# Patient Record
Sex: Male | Born: 1965 | Race: Black or African American | Hispanic: No | Marital: Single | Smoking: Never smoker
Health system: Southern US, Community
[De-identification: ages and names within clinical notes are randomized; demographics above are authoritative.]

## PROBLEM LIST (undated history)

## (undated) DIAGNOSIS — I10 Essential (primary) hypertension: Secondary | ICD-10-CM

## (undated) HISTORY — PX: KNEE SURGERY: SHX244

---

## 2016-02-12 ENCOUNTER — Emergency Department: Payer: Non-veteran care

## 2016-02-12 ENCOUNTER — Encounter: Payer: Self-pay | Admitting: Urgent Care

## 2016-02-12 ENCOUNTER — Emergency Department
Admission: EM | Admit: 2016-02-12 | Discharge: 2016-02-12 | Disposition: A | Discharge status: Home / Self Care | Payer: Non-veteran care | Attending: Emergency Medicine | Admitting: Emergency Medicine

## 2016-02-12 DIAGNOSIS — R0989 Other specified symptoms and signs involving the circulatory and respiratory systems: Secondary | ICD-10-CM | POA: Diagnosis not present

## 2016-02-12 DIAGNOSIS — E876 Hypokalemia: Secondary | ICD-10-CM | POA: Diagnosis not present

## 2016-02-12 DIAGNOSIS — R002 Palpitations: Secondary | ICD-10-CM

## 2016-02-12 DIAGNOSIS — I10 Essential (primary) hypertension: Secondary | ICD-10-CM | POA: Insufficient documentation

## 2016-02-12 HISTORY — DX: Essential (primary) hypertension: I10

## 2016-02-12 LAB — CBC
HCT: 41.1 % (ref 40.0–52.0)
HEMOGLOBIN: 13.7 g/dL (ref 13.0–18.0)
MCH: 26.1 pg (ref 26.0–34.0)
MCHC: 33.3 g/dL (ref 32.0–36.0)
MCV: 78.6 fL — ABNORMAL LOW (ref 80.0–100.0)
Platelets: 230 10*3/uL (ref 150–440)
RBC: 5.23 MIL/uL (ref 4.40–5.90)
RDW: 16.6 % — AB (ref 11.5–14.5)
WBC: 7.8 10*3/uL (ref 3.8–10.6)

## 2016-02-12 LAB — BASIC METABOLIC PANEL
ANION GAP: 8 (ref 5–15)
BUN: 17 mg/dL (ref 6–20)
CALCIUM: 9.3 mg/dL (ref 8.9–10.3)
CO2: 29 mmol/L (ref 22–32)
Chloride: 101 mmol/L (ref 101–111)
Creatinine, Ser: 1.08 mg/dL (ref 0.61–1.24)
GFR calc Af Amer: >60 mL/min (ref >60)
GLUCOSE: 95 mg/dL (ref 65–99)
Potassium: 3.2 mmol/L — ABNORMAL LOW (ref 3.5–5.1)
Sodium: 138 mmol/L (ref 135–145)

## 2016-02-12 LAB — TROPONIN I: TROPONIN I: 0.03 ng/mL (ref <0.031)

## 2016-02-12 MED ORDER — POTASSIUM CHLORIDE CRYS ER 20 MEQ PO TBCR
40.0000 meq | EXTENDED_RELEASE_TABLET | Freq: Once | ORAL | Status: AC
  Administered 2016-02-12: 40 meq via ORAL

## 2016-02-12 MED ORDER — POTASSIUM CHLORIDE CRYS ER 20 MEQ PO TBCR
EXTENDED_RELEASE_TABLET | ORAL | Status: AC
  Administered 2016-02-12: 40 meq via ORAL
  Filled 2016-02-12: qty 2

## 2016-02-12 NOTE — Discharge Instructions (Signed)
Please go to your appointment with Dr. Milta DeitersKhan's office tomorrow morning at 11 AM to be fitted for a Holter monitor.  Please return to the emergency department if you develop shortness of breath, chest pain or pressure, lightheadedness or fainting, or any other symptoms concerning to you.

## 2016-02-12 NOTE — ED Provider Notes (Signed)
[facility] Emergency Department Provider Note  ____________________________________________  Time seen: Approximately 8:44 PM  I have reviewed the triage vital signs and the nursing notes.   HISTORY  Chief Complaint Palpitations    HPI Paul Munoz is a 50 y.o. male with a history of hypertension on Lasix presenting with palpitations. The patient reports that for the past 3 days he has had approximate 5 daily episodes of a brief episode of palpitations that makes him feel like he has to sit down and catch his breath. These episodes self resolve and he does not know any inciting factors. No associated lightheadedness, syncope, chest pain, tightness, or pressure. No lower extremity swelling or calf pain.   Past Medical History  Diagnosis Date  . Hypertension     There are no active problems to display for this patient.   Past Surgical History  Procedure Laterality Date  . Knee surgery Left     No current outpatient prescriptions on file.  Allergies Review of patient's allergies indicates no known allergies.  No family history on file.  Social History Social History  Substance Use Topics  . Smoking status: Never Smoker   . Smokeless tobacco: None  . Alcohol Use: No    Review of Systems Constitutional: No fever/chills. Eyes: No visual changes. ENT: No sore throat. No congestion or rhinorrhea. Cardiovascular: Denies chest pain. Positive palpitations. Respiratory: Denies shortness of breath.  No cough. Gastrointestinal: No abdominal pain.  No nausea, no vomiting.  No diarrhea.  No constipation. Genitourinary: Negative for dysuria. Musculoskeletal: Negative for back pain. Skin: Negative for rash. Neurological: Negative for headaches. No focal numbness, tingling or weakness.   10-point ROS otherwise negative.  ____________________________________________   PHYSICAL EXAM:  VITAL SIGNS: ED Triage Vitals  Enc Vitals Group     BP  02/12/16 2002 153/88 mmHg     Pulse Rate 02/12/16 2002 91     Resp 02/12/16 2002 18     Temp 02/12/16 2002 98.6 F (37 C)     Temp Source 02/12/16 2002 Oral     SpO2 02/12/16 2002 97 %     Weight 02/12/16 2002 340 lb (154.223 kg)     Height 02/12/16 2002  (1.956 m)     Head Cir --      Peak Flow --      Pain Score 02/12/16 2009 0     Pain Loc --      Pain Edu? --      Excl. in GC? --     Constitutional: Alert and oriented. Well appearing and in no acute distress. Answers questions appropriately. Eyes: Conjunctivae are normal.  EOMI. No scleral icterus. Head: Atraumatic. Nose: No congestion/rhinnorhea. Mouth/Throat: Mucous membranes are moist.  Neck: No stridor.  Supple.   Cardiovascular: Normal rate, regular rhythm. No murmurs, rubs or gallops.  Respiratory: Normal respiratory effort.  No accessory muscle use or retractions. Lungs CTAB.  No wheezes, rales or ronchi. Gastrointestinal: Obese. Soft, nontender and nondistended.  No guarding or rebound.  No peritoneal signs. Musculoskeletal: Trace symmetric nonpitting lower extremity edema around the ankles only. No ttp in the calves or palpable cords.  Negative Homan's sign. Neurologic:  A&Ox3.  Speech is clear.  Face and smile are symmetric.  EOMI.  Moves all extremities well. Skin:  Skin is warm, dry and intact. No rash noted. Psychiatric: Mood and affect are normal. Speech and behavior are normal.  Normal judgement.  ____________________________________________   LABS (all labs ordered  are listed, but only abnormal results are displayed)  Labs Reviewed  BASIC METABOLIC PANEL - Abnormal; Notable for the following:    Potassium 3.2 (*)    All other components within normal limits  CBC - Abnormal; Notable for the following:    MCV 78.6 (*)    RDW 16.6 (*)    All other components within normal limits  TROPONIN I  TSH   ____________________________________________  EKG  ED ECG REPORT I, Rockne MenghiniNorman, Anne-Caroline, the  attending physician, personally viewed and interpreted this ECG.   Date: 02/12/2016  EKG Time: 2003  Rate: 91  Rhythm: normal sinus rhythm  Axis: Leftward  Intervals:none  ST&T Change: No ST elevation. Positive left ventricular hypertrophy  ____________________________________________  RADIOLOGY  Dg Chest 2 View  02/12/2016  CLINICAL DATA:  Acute onset of palpitations.  Initial encounter. EXAM: CHEST  2 VIEW COMPARISON:  None. FINDINGS: The lungs are well-aerated. Mild vascular congestion is noted. There is no evidence of focal opacification, pleural effusion or pneumothorax. The heart is normal in size; the mediastinal contour is within normal limits. No acute osseous abnormalities are seen. IMPRESSION: Mild vascular congestion noted.  Lungs otherwise grossly clear. Electronically Signed   By: Roanna RaiderJeffery  Chang M.D.   On: 02/12/2016 20:40    ____________________________________________   PROCEDURES  Procedure(s) performed: None  Critical Care performed: No ____________________________________________   INITIAL IMPRESSION / ASSESSMENT AND PLAN / ED COURSE  Pertinent labs & imaging results that were available during my care of the patient were reviewed by me and considered in my medical decision making (see chart for details).  50 y.o. male with a history of hypertension on Lasix presenting with palpitations. Overall, the patient's symptoms are mild and self-limited. I do not see any evidence of an arrhythmia on his EKG at this time, although he is asymptomatic. He may benefit from having a Holter monitor, and I spoke with Dr. Park BreedKahn to have him set up for that tomorrow at 11 AM. It is unlikely that this recent patient is a typical ACS or MI, the patient's troponin is negative. He does have some mild hypokalemia which I have supplemented and is likely due to his diuretic; it is possible that this is driving his palpitations but other possibilities need to be excluded as well. The  patient's chest x-ray shows some mild vascular congestion, and I wonder if he has early CHF. However given that he has no evidence of significant fluid overload or other significant symptoms, he does not warrant emergency room evaluation for this. Dr. Park BreedKahn can follow this up as well tomorrow. Will plan to discharge the patient home and he and his wife understand return precautions as well as follow-up instructions.  ____________________________________________  FINAL CLINICAL IMPRESSION(S) / ED DIAGNOSES  Final diagnoses:  Palpitations  Hypokalemia  Pulmonary vascular congestion      NEW MEDICATIONS STARTED DURING THIS VISIT:  There are no discharge medications for this patient.     Rockne MenghiniAnne-Caroline Brylinn Teaney, MD 02/12/16 2133

## 2016-02-12 NOTE — ED Notes (Signed)
Patient presents with c/o palpitations that began on Monday. Patient denies CP, SOB, diaphoresis, and extremity pain.

## 2016-02-13 LAB — TSH: TSH: 2.641 u[IU]/mL (ref 0.350–4.500)

## 2020-09-25 ENCOUNTER — Ambulatory Visit
Admission: EM | Admit: 2020-09-25 | Discharge: 2020-09-25 | Disposition: A | Discharge status: Home Health | Payer: Non-veteran care | Attending: Family Medicine | Admitting: Family Medicine

## 2020-09-25 ENCOUNTER — Other Ambulatory Visit: Payer: Self-pay

## 2020-09-25 ENCOUNTER — Encounter: Payer: Self-pay | Admitting: Emergency Medicine

## 2020-09-25 DIAGNOSIS — M1711 Unilateral primary osteoarthritis, right knee: Secondary | ICD-10-CM

## 2020-09-25 DIAGNOSIS — M545 Low back pain, unspecified: Secondary | ICD-10-CM

## 2020-09-25 MED ORDER — TRAMADOL HCL 50 MG PO TABS: 50.0000 mg | ORAL_TABLET | Freq: Three times a day (TID) | ORAL | 0 refills | Status: DC | PRN

## 2020-09-25 MED ORDER — PREDNISONE 10 MG PO TABS
ORAL_TABLET | ORAL | 0 refills | Status: DC
Start: 2020-09-25 — End: 2021-02-11

## 2020-09-25 NOTE — Discharge Instructions (Signed)
Rest, ice.  Medication as prescribed.  Take care  Dr. Valisa Karpel  

## 2020-09-25 NOTE — ED Provider Notes (Signed)
[facility]    CSN: [id number] Arrival date & time: 09/25/20  1446   History   Chief Complaint Chief Complaint  Patient presents with  . Back Pain  . Knee Pain   HPI  55 year old male presents with the above complaints.  Patient reports that he has had ongoing pain since the fifth or 6 January.  He reports right knee pain and right-sided low back pain with radiation to the hip.  Denies fall, trauma, injury.  Patient does have some pain with certain activities.  He rates his pain as 9/10 in severity.  No relieving factors.  No reported swelling.  No other reported symptoms.  No other complaints.  Past Medical History:  Diagnosis Date  . Hypertension    Past Surgical History:  Procedure Laterality Date  . KNEE SURGERY Left    Home Medications    Prior to Admission medications   Medication Sig Start Date End Date Taking? Authorizing Provider  amLODipine (NORVASC) 5 MG tablet Take 5 mg by mouth daily.   Yes [provider]  hydrochlorothiazide (HYDRODIURIL) 50 MG tablet Take 50 mg by mouth daily.   Yes [provider]  predniSONE (DELTASONE) 10 MG tablet 50 mg daily x 2 days, then 40 mg daily x 2 days, then 30 mg daily x 2 days, then 20 mg daily x 2 days, then 10 mg daily x 2 days. 09/25/20  Yes Rondel Episcopo G, DO  traMADol (ULTRAM) 50 MG tablet Take 1 tablet (50 mg total) by mouth every 8 (eight) hours as needed for moderate pain or severe pain. 09/25/20  Yes Tommie Sams, DO    Social History Social History   Tobacco Use  . Smoking status: Never Smoker  . Smokeless tobacco: Never Used  Substance Use Topics  . Alcohol use: No     Allergies   Patient has no known allergies.   Review of Systems Review of Systems  Musculoskeletal:       Right knee pain. Low back pain.   Physical Exam Triage Vital Signs ED Triage Vitals  Enc Vitals Group     BP 09/25/20 1610 (!) 145/92     Pulse Rate 09/25/20 1610 80     Resp 09/25/20 1610 18      Temp 09/25/20 1610 98.9 F (37.2 C)     Temp Source 09/25/20 1610 Oral     SpO2 09/25/20 1610 98 %     Weight --      Height --      Head Circumference --      Peak Flow --      Pain Score 09/25/20 1605 9     Pain Loc --      Pain Edu? --      Excl. in GC? --    No data found.  Updated Vital Signs BP (!) 145/92 (BP Location: Left Arm)   Pulse 80   Temp 98.9 F (37.2 C) (Oral)   Resp 18   SpO2 98%   Visual Acuity Right Eye Distance:   Left Eye Distance:   Bilateral Distance:    Right Eye Near:   Left Eye Near:    Bilateral Near:     Physical Exam Vitals and nursing note reviewed.  Constitutional:      General: He is not in acute distress.    Appearance: Normal appearance. He is obese. He is not ill-appearing.  HENT:     Head: Normocephalic.  Eyes:  General:        Right eye: No discharge.        Left eye: No discharge.     Conjunctiva/sclera: Conjunctivae normal.  Pulmonary:     Effort: Pulmonary effort is normal. No respiratory distress.  Musculoskeletal:     Comments: Right knee-no anterior joint line tenderness.  Crepitus on exam.    Neurological:     Mental Status: He is alert.  Psychiatric:        Mood and Affect: Mood normal.        Behavior: Behavior normal.    UC Treatments / Results  Labs (all labs ordered are listed, but only abnormal results are displayed) Labs Reviewed - No data to display  EKG   Radiology No results found.  Procedures Procedures (including critical care time)  Medications Ordered in UC Medications - No data to display  Initial Impression / Assessment and Plan / UC Course  I have reviewed the triage vital signs and the nursing notes.  Pertinent labs & imaging results that were available during my care of the patient were reviewed by me and considered in my medical decision making (see chart for details).    55 year old male presents with right knee pain and right-sided back pain.  These are acute on chronic  issues.  Tramadol as needed.  Placing on prednisone.  Supportive care.  Final Clinical Impressions(s) / UC Diagnoses   Final diagnoses:  Primary osteoarthritis of right knee  Acute right-sided low back pain, unspecified whether sciatica present     Discharge Instructions     Rest, ice.  Medication as prescribed.  Take care  Dr. Adriana Simas    ED Prescriptions    Medication Sig Dispense Auth. Provider   traMADol (ULTRAM) 50 MG tablet Take 1 tablet (50 mg total) by mouth every 8 (eight) hours as needed for moderate pain or severe pain. 10 tablet Nettie Cromwell G, DO   predniSONE (DELTASONE) 10 MG tablet 50 mg daily x 2 days, then 40 mg daily x 2 days, then 30 mg daily x 2 days, then 20 mg daily x 2 days, then 10 mg daily x 2 days. 30 tablet Everlene Other G, DO     I have reviewed the PDMP during this encounter.   Tommie Sams, [place] 09/25/20 1756

## 2020-09-25 NOTE — ED Triage Notes (Addendum)
Pt states that his back and knee pain has flared up. Pt states that it is on his right side and right knee. Denies any swelling and per his provider when he has a flare in his knee he needs to be seen in the next 12 hours.

## 2021-02-07 ENCOUNTER — Encounter: Payer: Self-pay | Admitting: Gynecology

## 2021-02-07 ENCOUNTER — Other Ambulatory Visit: Payer: Self-pay

## 2021-02-07 ENCOUNTER — Ambulatory Visit
Admission: EM | Admit: 2021-02-07 | Discharge: 2021-02-07 | Disposition: A | Discharge status: Home / Self Care | Payer: No Typology Code available for payment source | Attending: Sports Medicine | Admitting: Sports Medicine

## 2021-02-07 DIAGNOSIS — Z20822 Contact with and (suspected) exposure to covid-19: Secondary | ICD-10-CM

## 2021-02-07 DIAGNOSIS — U071 COVID-19: Secondary | ICD-10-CM | POA: Insufficient documentation

## 2021-02-07 NOTE — ED Provider Notes (Signed)
[facility]    CSN: [id number] Arrival date & time: 02/07/21  1726      History   Chief Complaint Chief Complaint  Patient presents with  . nurse visit    HPI Paul Munoz is a 55 y.o. male.   covid exposure - wife tested positive     Past Medical History:  Diagnosis Date  . Hypertension     There are no problems to display for this patient.   Past Surgical History:  Procedure Laterality Date  . KNEE SURGERY Left        Home Medications    Prior to Admission medications   Medication Sig Start Date End Date Taking? Authorizing Provider  acetaminophen (TYLENOL) 325 MG tablet Take by mouth. 03/01/20  Yes [provider]  amLODipine (NORVASC) 5 MG tablet Take by mouth. 12/16/20  Yes [provider]  hydrochlorothiazide (HYDRODIURIL) 25 MG tablet Take by mouth. 12/16/20  Yes [provider]  hydrochlorothiazide (HYDRODIURIL) 50 MG tablet Take 50 mg by mouth daily.   Yes [provider]  meloxicam (MOBIC) 15 MG tablet meloxicam 15 mg tablet  TK 1 T PO QD   Yes [provider]  predniSONE (DELTASONE) 10 MG tablet 50 mg daily x 2 days, then 40 mg daily x 2 days, then 30 mg daily x 2 days, then 20 mg daily x 2 days, then 10 mg daily x 2 days. 09/25/20  Yes Cook, Jayce G, DO  traMADol (ULTRAM) 50 MG tablet Take 1 tablet (50 mg total) by mouth every 8 (eight) hours as needed for moderate pain or severe pain. 09/25/20  Yes Cook, Jayce G, DO  amLODipine (NORVASC) 5 MG tablet Take 5 mg by mouth daily.    [provider]    Family History No family history on file.  Social History Social History   Tobacco Use  . Smoking status: Never Smoker  . Smokeless tobacco: Never Used  Substance Use Topics  . Alcohol use: No     Allergies   Patient has no known allergies.   Review of Systems Review of Systems   Physical Exam Triage Vital Signs ED Triage Vitals  Enc Vitals Group     BP 02/07/21 1806  126/89     Pulse --      Resp 02/07/21 1806 16     Temp 02/07/21 1806 99.7 F (37.6 C)     Temp Source 02/07/21 1806 Oral     SpO2 02/07/21 1806 98 %     Weight 02/07/21 1808 (!) 325 lb (147.4 kg)     Height --      Head Circumference --      Peak Flow --      Pain Score 02/07/21 1808 0     Pain Loc --      Pain Edu? --      Excl. in GC? --    No data found.  Updated Vital Signs BP 126/89 (BP Location: Left Arm)   Temp 99.7 F (37.6 C) (Oral)   Resp 16   Wt (!) 147.4 kg   SpO2 98%   BMI 38.54 kg/m   Visual Acuity Right Eye Distance:   Left Eye Distance:   Bilateral Distance:    Right Eye Near:   Left Eye Near:    Bilateral Near:     Physical Exam   UC Treatments / Results  Labs (all labs ordered are listed, but only abnormal results are displayed)  Labs Reviewed  SARS CORONAVIRUS 2 (TAT 6-24 HRS)    EKG   Radiology No results found.  Procedures Procedures (including critical care time)  Medications Ordered in UC Medications - No data to display  Initial Impression / Assessment and Plan / UC Course  I have reviewed the triage vital signs and the nursing notes.  Pertinent labs & imaging results that were available during my care of the patient were reviewed by me and considered in my medical decision making (see chart for details).    Final Clinical Impressions(s) / UC Diagnoses   Final diagnoses:  Exposure to confirmed case of COVID-19   Discharge Instructions   None    ED Prescriptions    None     PDMP not reviewed this encounter.   Delton See, MD 02/07/21 2232

## 2021-02-07 NOTE — ED Notes (Signed)
Patient here today for covid testing, Per patient denies any fever/ cough/ body ache. Per patient has no symptoms. His wife had a positive covid test and he just wanted to be tested.

## 2021-02-07 NOTE — ED Triage Notes (Signed)
Per patient wife positive with covid and need to be tested.

## 2021-02-08 LAB — SARS CORONAVIRUS 2 (TAT 6-24 HRS): SARS Coronavirus 2: POSITIVE — AB

## 2021-02-09 ENCOUNTER — Telehealth: Payer: Self-pay | Admitting: Physician Assistant

## 2021-02-09 ENCOUNTER — Other Ambulatory Visit: Payer: Self-pay | Admitting: Physician Assistant

## 2021-02-09 NOTE — Telephone Encounter (Signed)
Called to discuss with Paul Munoz about Covid symptoms and the use of bebtelivomab, remdisivir or oral therapies for those with mild to moderate Covid symptoms and at a high risk of hospitalization.      Pt does not qualify as pt has asymptomatic infection. Isolation precautions discussed. Advised to contact back for consideration should they develop symptoms. Patient verbalized understanding.   He is unvaccinated and high risk with morbid obesity and HTN but pt declines potential treatment even if he went on to develop sx. Declines a mychart message or hotline number in case he changed his mind.   [name] PA-C

## 2021-02-11 ENCOUNTER — Other Ambulatory Visit: Payer: Self-pay

## 2021-02-11 ENCOUNTER — Ambulatory Visit
Admission: EM | Admit: 2021-02-11 | Discharge: 2021-02-11 | Disposition: A | Discharge status: Home / Self Care | Payer: Non-veteran care

## 2021-02-11 DIAGNOSIS — U071 COVID-19: Secondary | ICD-10-CM

## 2021-02-11 NOTE — ED Provider Notes (Signed)
[facility]    CSN: [id number] Arrival date & time: 02/11/21  1024      History   Chief Complaint Chief Complaint  Patient presents with  . Covid Positive    HPI Paul Munoz is a 55 y.o. male who presents due to having URI symptoms 8 days ago and he tested positive for covid 4 days ago. Needs a note to return to work. Also would like his lungs checked. He started with a ST but is better. Had fatigue for a couple of days. Has minimal cough. His energy is back to normal. Denies feeling SOB or whezing. His wife is very concerned about him and he needs instructions in writing for her to read them.  He did not take any OTC meds for symptoms. Wants to know if he could take OTC med for cough.   Past Medical History:  Diagnosis Date  . Hypertension     There are no problems to display for this patient.   Past Surgical History:  Procedure Laterality Date  . KNEE SURGERY Left      Home Medications    Prior to Admission medications   Medication Sig Start Date End Date Taking? Authorizing Provider  acetaminophen (TYLENOL) 325 MG tablet Take by mouth. 03/01/20  Yes [provider]  amLODipine (NORVASC) 5 MG tablet Take 5 mg by mouth daily.   Yes [provider]  amLODipine (NORVASC) 5 MG tablet Take by mouth. 12/16/20  Yes [provider]  hydrochlorothiazide (HYDRODIURIL) 25 MG tablet Take by mouth. 12/16/20  Yes [provider]  hydrochlorothiazide (HYDRODIURIL) 50 MG tablet Take 50 mg by mouth daily.   Yes [provider]  meloxicam (MOBIC) 15 MG tablet meloxicam 15 mg tablet  TK 1 T PO QD   Yes [provider]    Family History History reviewed. No pertinent family history.  Social History Social History   Tobacco Use  . Smoking status: Never Smoker  . Smokeless tobacco: Never Used  Substance Use Topics  . Alcohol use: No     Allergies   Patient has no known allergies.   Review of Systems Review  of Systems Mild cough, the rest of 10 point ROS is neg.   Physical Exam Triage Vital Signs ED Triage Vitals  Enc Vitals Group     BP 02/11/21 1054 138/73     Pulse Rate 02/11/21 1054 78     Resp 02/11/21 1054 17     Temp 02/11/21 1054 98.8 F (37.1 C)     Temp Source 02/11/21 1054 Oral     SpO2 02/11/21 1054 96 %     Weight 02/11/21 1053 (!) 324 lb 1.2 oz (147 kg)     Height --      Head Circumference --      Peak Flow --      Pain Score 02/11/21 1052 0     Pain Loc --      Pain Edu? --      Excl. in GC? --    No data found.  Updated Vital Signs BP 138/73 (BP Location: Left Arm)   Pulse 78   Temp 98.8 F (37.1 C) (Oral)   Resp 17   Wt (!) 324 lb 1.2 oz (147 kg)   SpO2 96%   BMI 38.43 kg/m   Visual Acuity Right Eye Distance:   Left Eye Distance:   Bilateral Distance:    Right Eye Near:   Left Eye  Near:    Bilateral Near:     Physical Exam Physical Exam Vitals signs and nursing note reviewed.  Constitutional:      General: He is not in acute distress.    Appearance: Normal appearance. He is not ill-appearing, toxic-appearing or diaphoretic.  HENT:     Head: Normocephalic.     Right Ear:  external ear normal.     Left Ear:  external ear normal.     Nose: Nose normal.  Eyes:     General: No scleral icterus.       Right eye: No discharge.        Left eye: No discharge.     Conjunctiva/sclera: Conjunctivae normal.  Neck:     Musculoskeletal: Neck supple. No neck rigidity.  Cardiovascular:     Rate and Rhythm: Normal rate and regular rhythm.     Heart sounds: No murmur.  Pulmonary:     Effort: Pulmonary effort is normal.     Breath sounds: Normal breath sounds.   Musculoskeletal: Normal range of motion.  Lymphadenopathy:     Cervical: No cervical adenopathy.  Skin:    General: Skin is warm and dry.     Coloration: Skin is not jaundiced.     Findings: No rash.  Neurological:     Mental Status: He is alert and oriented to person, place, and time.      Gait: Gait normal.  Psychiatric:        Mood and Affect: Mood normal.        Behavior: Behavior normal.        Thought Content: Thought content normal.        Judgment: Judgment normal.     UC Treatments / Results  Labs (all labs ordered are listed, but only abnormal results are displayed) Labs Reviewed - No data to display  EKG   Radiology No results found.  Procedures Procedures (including critical care time)  Medications Ordered in UC Medications - No data to display  Initial Impression / Assessment and Plan / UC Course  I have reviewed the triage vital signs and the nursing notes. Recoved from covid infection. He was cleared to return to work tomorrow.  Final Clinical Impressions(s) / UC Diagnoses   Final diagnoses:  COVID-19 virus infection     Discharge Instructions     Your lungs sound clear.  You may take any over the counter cough medication as needed. The covid cough may last up to one month. But if the cough gets worse or you feel shortness of breath, you need to be seen again. I believe you have recovered well.  You may return tomorrow back to work since you are past the 5 days quarantine period, but you must still wear a mask for 2 more days.  You may stop wearing a mask at home or family by Friday this week     ED Prescriptions    None     PDMP not reviewed this encounter.   Garey Ham, PA-C 02/11/21 1137

## 2021-02-11 NOTE — Discharge Instructions (Addendum)
Your lungs sound clear.  You may take any over the counter cough medication as needed. The covid cough may last up to one month. But if the cough gets worse or you feel shortness of breath, you need to be seen again. I believe you have recovered well.  You may return tomorrow back to work since you are past the 5 days quarantine period, but you must still wear a mask for 2 more days.  You may stop wearing a mask at home or family by Friday this week

## 2021-02-11 NOTE — ED Triage Notes (Signed)
Patient states that he is covid positive (tested positive on Friday) but would like to have his lungs checked today. Denies that he is having cough or shortness of breath. States that he needs a note returning to work. Patient states that symptoms originally started a few days prior to testing.

## 2021-08-30 ENCOUNTER — Other Ambulatory Visit: Payer: Self-pay

## 2021-08-30 ENCOUNTER — Ambulatory Visit
Admission: EM | Admit: 2021-08-30 | Discharge: 2021-08-30 | Disposition: A | Discharge status: Home / Self Care | Payer: Non-veteran care | Attending: Emergency Medicine | Admitting: Emergency Medicine

## 2021-08-30 ENCOUNTER — Ambulatory Visit (INDEPENDENT_AMBULATORY_CARE_PROVIDER_SITE_OTHER): Payer: Non-veteran care

## 2021-08-30 ENCOUNTER — Ambulatory Visit: Payer: Non-veteran care

## 2021-08-30 DIAGNOSIS — M25561 Pain in right knee: Secondary | ICD-10-CM

## 2021-08-30 DIAGNOSIS — M25562 Pain in left knee: Secondary | ICD-10-CM

## 2021-08-30 DIAGNOSIS — M25462 Effusion, left knee: Secondary | ICD-10-CM
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
## 2022-04-10 ENCOUNTER — Ambulatory Visit
Admission: EM | Admit: 2022-04-10 | Discharge: 2022-04-10 | Disposition: A | Discharge status: Home / Self Care | Payer: Non-veteran care | Attending: Emergency Medicine | Admitting: Emergency Medicine

## 2022-04-10 ENCOUNTER — Ambulatory Visit (INDEPENDENT_AMBULATORY_CARE_PROVIDER_SITE_OTHER): Payer: Non-veteran care

## 2022-04-10 DIAGNOSIS — S39012A Strain of muscle, fascia and tendon of lower back, initial encounter: Secondary | ICD-10-CM

## 2022-04-10 DIAGNOSIS — M542 Cervicalgia: Secondary | ICD-10-CM

## 2022-04-10 DIAGNOSIS — M545 Low back pain, unspecified: Secondary | ICD-10-CM | POA: Diagnosis not present

## 2022-04-10 DIAGNOSIS — S161XXA Strain of muscle, fascia and tendon at neck level, initial encounter: Secondary | ICD-10-CM

## 2022-04-10 MED ORDER — BACLOFEN 10 MG PO TABS: 10.0000 mg | ORAL_TABLET | Freq: Three times a day (TID) | ORAL | 0 refills | Status: AC

## 2022-04-10 MED ORDER — IBUPROFEN 600 MG PO TABS: 600.0000 mg | ORAL_TABLET | Freq: Four times a day (QID) | ORAL | 0 refills | Status: AC | PRN
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
## 2022-04-10 NOTE — Discharge Instructions (Addendum)
Take the ibuprofen, 600 mg every 6 hours with food, on a schedule for the next 48 hours and then as needed.  Take the baclofen, 10 mg every 8 hours, on a schedule for the next 48 hours and then as needed.  Apply moist heat to your neck/back for 30 minutes at a time 2-3 times a day to improve blood flow to the area and help remove the lactic acid causing the spasm.  Follow the neck/back exercises given at discharge.  Return for reevaluation for any new or worsening symptoms.
